# Patient Record
Sex: Female | Born: 1965 | Race: Black or African American | Hispanic: No | Marital: Married | Smoking: Current every day smoker
Health system: Southern US, Community
[De-identification: ages and names within clinical notes are randomized; demographics above are authoritative.]

---

## 1999-12-23 ENCOUNTER — Emergency Department (HOSPITAL_COMMUNITY): Admission: EM | Admit: 1999-12-23 | Discharge: 1999-12-23 | Payer: Self-pay | Admitting: Emergency Medicine

## 2000-07-17 ENCOUNTER — Emergency Department (HOSPITAL_COMMUNITY): Admission: EM | Admit: 2000-07-17 | Discharge: 2000-07-17 | Payer: Self-pay | Admitting: Emergency Medicine

## 2000-10-01 ENCOUNTER — Emergency Department (HOSPITAL_COMMUNITY): Admission: EM | Admit: 2000-10-01 | Discharge: 2000-10-01 | Payer: Self-pay | Admitting: Emergency Medicine

## 2001-05-04 ENCOUNTER — Encounter: Payer: Self-pay | Admitting: Emergency Medicine

## 2001-05-04 ENCOUNTER — Emergency Department (HOSPITAL_COMMUNITY): Admission: EM | Admit: 2001-05-04 | Discharge: 2001-05-04 | Payer: Self-pay | Admitting: Emergency Medicine

## 2001-05-13 ENCOUNTER — Emergency Department (HOSPITAL_COMMUNITY): Admission: EM | Admit: 2001-05-13 | Discharge: 2001-05-13 | Payer: Self-pay | Admitting: Emergency Medicine

## 2001-05-27 ENCOUNTER — Emergency Department (HOSPITAL_COMMUNITY): Admission: EM | Admit: 2001-05-27 | Discharge: 2001-05-28 | Payer: Self-pay | Admitting: *Deleted

## 2001-07-01 ENCOUNTER — Emergency Department (HOSPITAL_COMMUNITY): Admission: EM | Admit: 2001-07-01 | Discharge: 2001-07-01 | Payer: Self-pay | Admitting: Emergency Medicine

## 2018-01-09 ENCOUNTER — Emergency Department (HOSPITAL_BASED_OUTPATIENT_CLINIC_OR_DEPARTMENT_OTHER): Payer: PRIVATE HEALTH INSURANCE

## 2018-01-09 ENCOUNTER — Other Ambulatory Visit: Payer: Self-pay

## 2018-01-09 ENCOUNTER — Encounter (HOSPITAL_BASED_OUTPATIENT_CLINIC_OR_DEPARTMENT_OTHER): Payer: Self-pay | Admitting: Emergency Medicine

## 2018-01-09 ENCOUNTER — Emergency Department (HOSPITAL_BASED_OUTPATIENT_CLINIC_OR_DEPARTMENT_OTHER)
Admission: EM | Admit: 2018-01-09 | Discharge: 2018-01-09 | Disposition: A | Discharge status: Home / Self Care | Payer: PRIVATE HEALTH INSURANCE | Attending: Emergency Medicine | Admitting: Emergency Medicine

## 2018-01-09 DIAGNOSIS — Y998 Other external cause status: Secondary | ICD-10-CM | POA: Diagnosis not present

## 2018-01-09 DIAGNOSIS — S93402A Sprain of unspecified ligament of left ankle, initial encounter: Secondary | ICD-10-CM | POA: Insufficient documentation

## 2018-01-09 DIAGNOSIS — Z79899 Other long term (current) drug therapy: Secondary | ICD-10-CM | POA: Diagnosis not present

## 2018-01-09 DIAGNOSIS — X509XXA Other and unspecified overexertion or strenuous movements or postures, initial encounter: Secondary | ICD-10-CM | POA: Insufficient documentation

## 2018-01-09 DIAGNOSIS — M62830 Muscle spasm of back: Secondary | ICD-10-CM

## 2018-01-09 DIAGNOSIS — M25511 Pain in right shoulder: Secondary | ICD-10-CM | POA: Diagnosis not present

## 2018-01-09 DIAGNOSIS — Y9301 Activity, walking, marching and hiking: Secondary | ICD-10-CM | POA: Insufficient documentation

## 2018-01-09 DIAGNOSIS — S92155A Nondisplaced avulsion fracture (chip fracture) of left talus, initial encounter for closed fracture: Secondary | ICD-10-CM | POA: Diagnosis not present

## 2018-01-09 DIAGNOSIS — S99912A Unspecified injury of left ankle, initial encounter: Secondary | ICD-10-CM | POA: Diagnosis present

## 2018-01-09 DIAGNOSIS — Y929 Unspecified place or not applicable: Secondary | ICD-10-CM | POA: Insufficient documentation

## 2018-01-09 DIAGNOSIS — M549 Dorsalgia, unspecified: Secondary | ICD-10-CM | POA: Diagnosis not present

## 2018-01-09 DIAGNOSIS — M25512 Pain in left shoulder: Secondary | ICD-10-CM | POA: Diagnosis not present

## 2018-01-09 DIAGNOSIS — F17200 Nicotine dependence, unspecified, uncomplicated: Secondary | ICD-10-CM | POA: Diagnosis not present

## 2018-01-09 MED ORDER — CYCLOBENZAPRINE HCL 10 MG PO TABS: 10.0000 mg | ORAL_TABLET | Freq: Two times a day (BID) | ORAL | 0 refills | Status: AC | PRN
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
## 2018-01-09 NOTE — Discharge Instructions (Signed)
Please read instructions below. Apply ice to your ankle for 20 minutes at a time. Elevate it as much as possible. You can take ibuprofen every 6 hours as needed for pain. You can take flexeril every 12 hours as needed for  muscle spasm. Be aware this medication can make you drowsy. Avoid driving or drinking alcohol with this medication. Schedule an appointment with the orthopedic specialist in 1-2 weeks as needed if symptoms persist. Return to the ER for new or concerning symptoms.
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
## 2018-09-25 ENCOUNTER — Other Ambulatory Visit: Payer: Self-pay

## 2018-09-25 DIAGNOSIS — Z20822 Contact with and (suspected) exposure to covid-19: Secondary | ICD-10-CM

## 2018-09-27 LAB — NOVEL CORONAVIRUS, NAA: SARS-CoV-2, NAA: NOT DETECTED

## 2019-10-08 IMAGING — CR DG ANKLE COMPLETE 3+V*L*
3 series · 3 of 3 positions shown · non-contrast
Comparison: None.

CLINICAL DATA: Pain following fall

EXAM:
LEFT ANKLE COMPLETE - 3+ VIEW

[t ankle joint ap left]
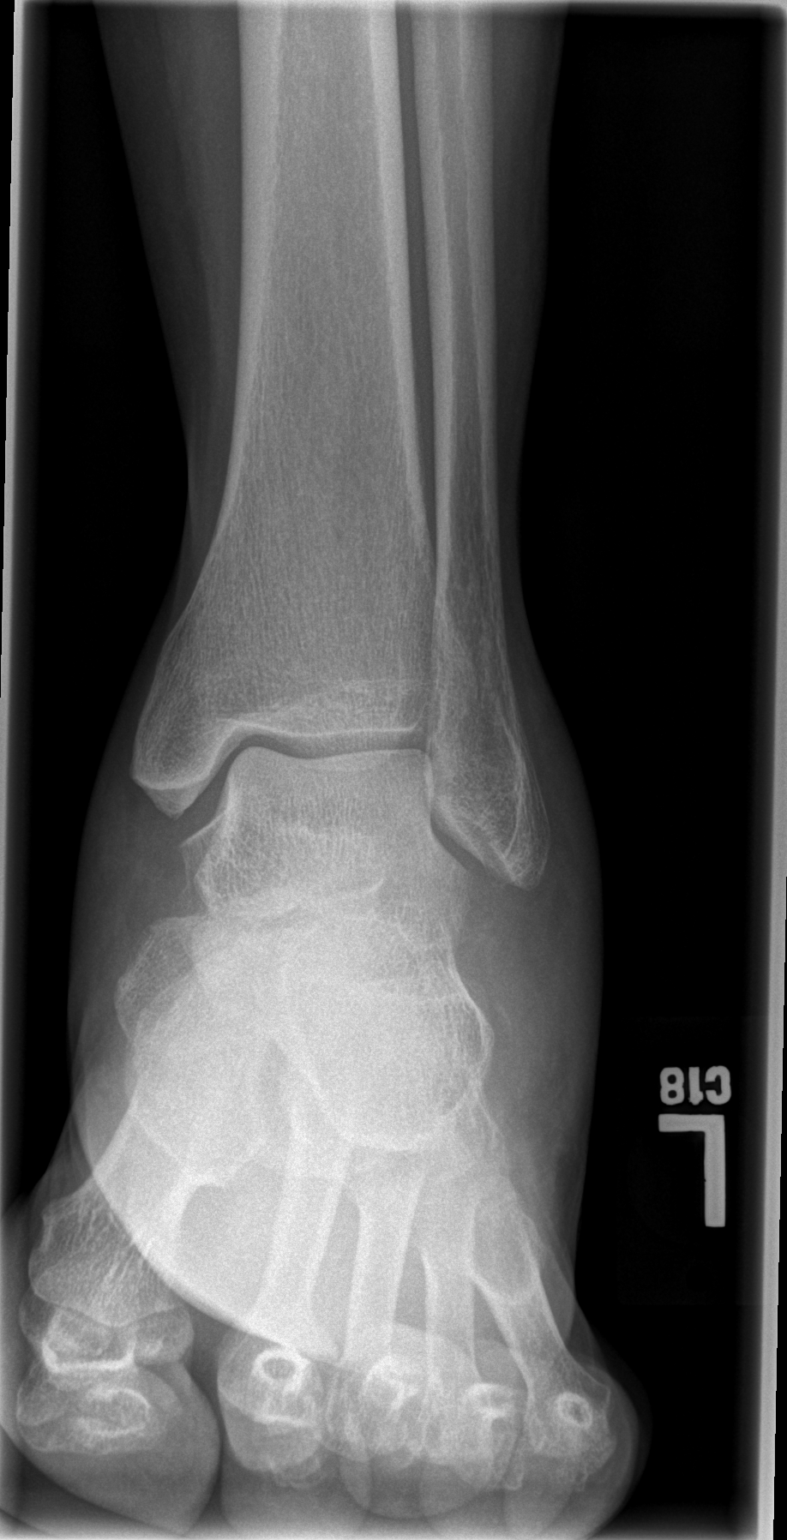

[t ankle joint oblique left]
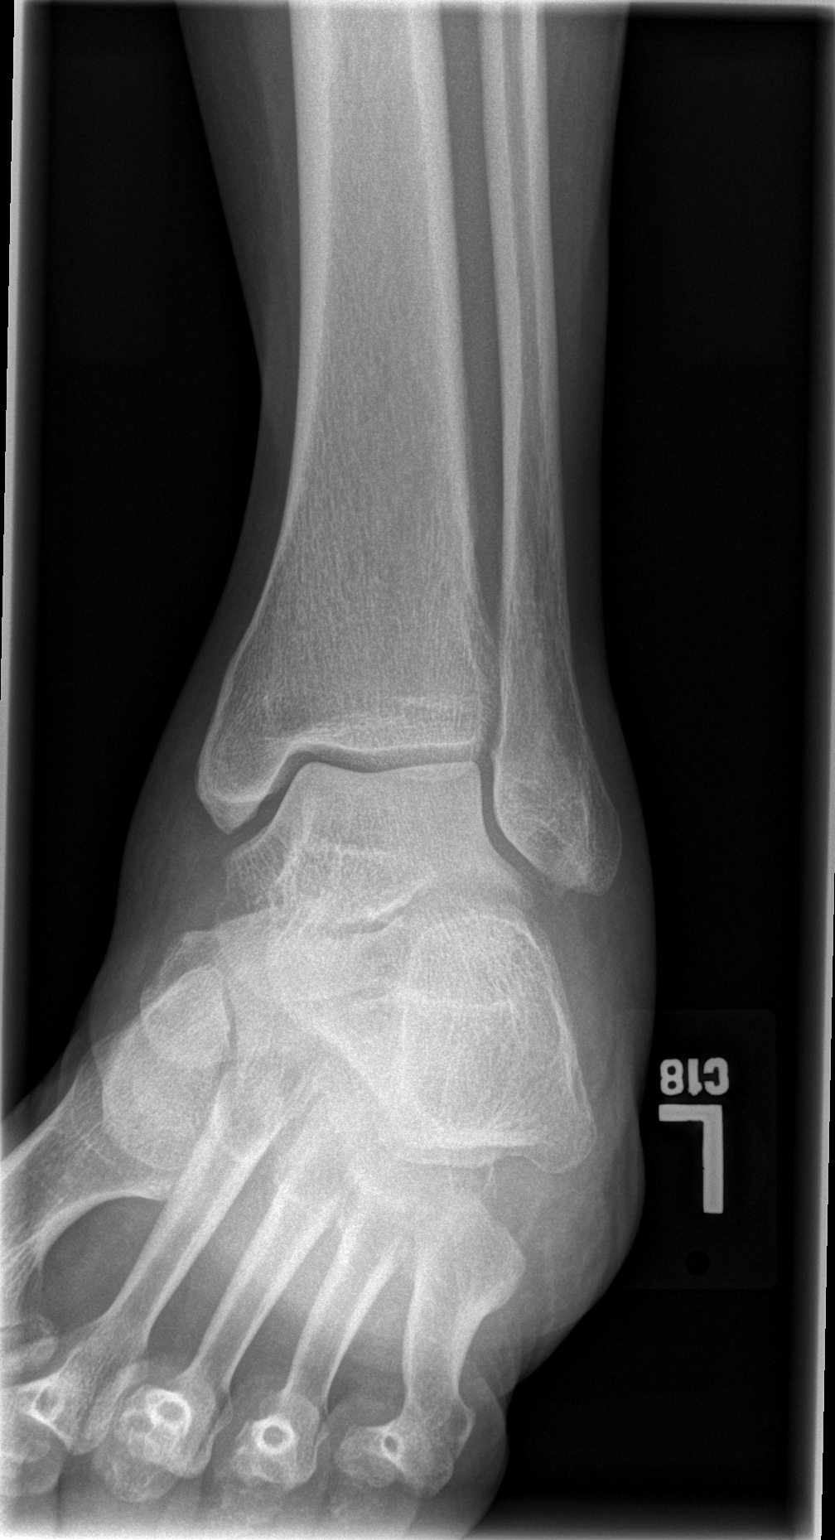

[t ankle joint lat left]
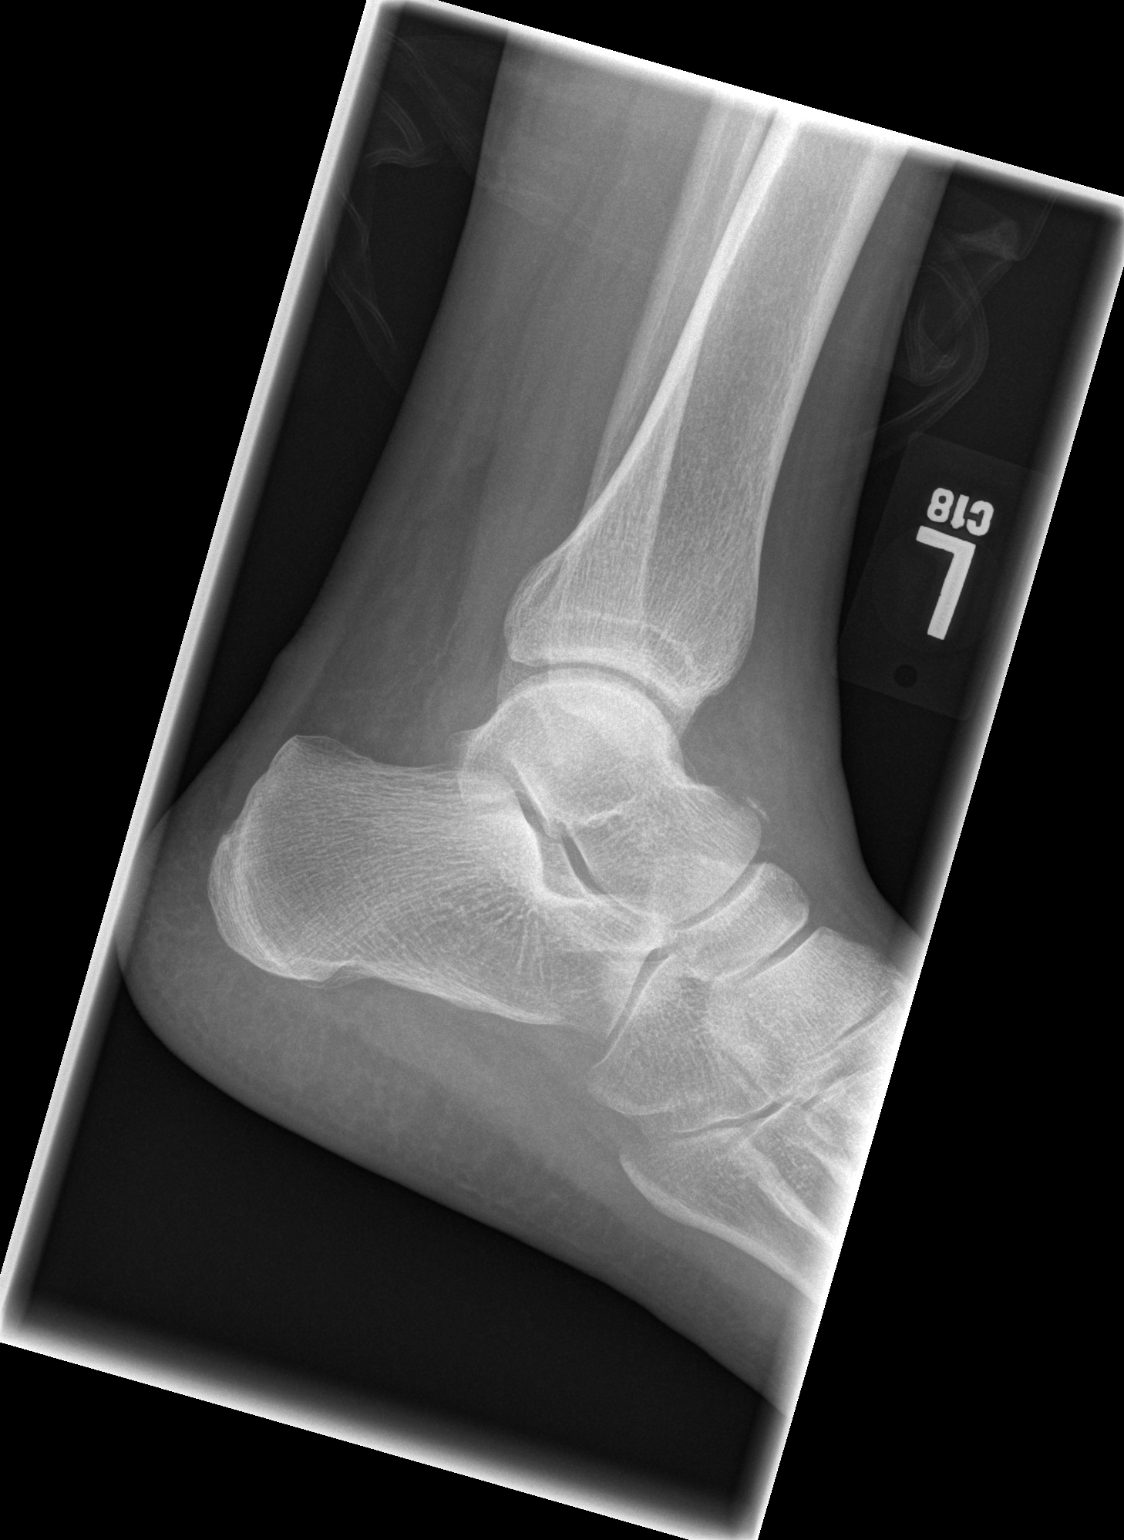

[3 of 3 positions shown; findings below may reference images not displayed]

FINDINGS: Frontal, oblique, and lateral views were obtained. There is
generalized soft tissue swelling. There is a small avulsion arising
from the dorsal distal talus. No other evident fracture. No joint
effusion. Joint spaces appear normal. No erosive change. Ankle
mortise appears intact..
IMPRESSION: Small avulsion arising from the dorsal distal talus. No other
evident fracture. Soft tissue swelling noted. Ankle mortise appears
intact. No appreciable underlying arthropathy.
